# Patient Record
Sex: Male | Born: 1960 | Race: White | Hispanic: No | Smoking: Never smoker
Health system: Southern US, Community
[De-identification: ages and names within clinical notes are randomized; demographics above are authoritative.]

## PROBLEM LIST (undated history)

## (undated) DIAGNOSIS — K5190 Ulcerative colitis, unspecified, without complications: Secondary | ICD-10-CM

## (undated) DIAGNOSIS — Z9889 Other specified postprocedural states: Secondary | ICD-10-CM

## (undated) HISTORY — DX: Ulcerative colitis, unspecified, without complications: K51.90

## (undated) HISTORY — DX: Other specified postprocedural states: Z98.890

---

## 2012-02-21 HISTORY — PX: ILEOSTOMY CLOSURE: SHX1784

## 2013-03-06 HISTORY — PX: COLOPROCTECTOMY W/ ILEO J POUCH: SUR277

## 2015-01-04 ENCOUNTER — Encounter: Payer: Self-pay | Admitting: Physician Assistant

## 2015-01-04 ENCOUNTER — Ambulatory Visit (INDEPENDENT_AMBULATORY_CARE_PROVIDER_SITE_OTHER): Payer: Managed Care, Other (non HMO) | Admitting: Physician Assistant

## 2015-01-04 VITALS — BP 120/88 | HR 78 | Temp 97.7°F | Resp 16 | Ht 68.0 in | Wt 202.0 lb

## 2015-01-04 DIAGNOSIS — Z0000 Encounter for general adult medical examination without abnormal findings: Secondary | ICD-10-CM

## 2015-01-04 DIAGNOSIS — Z113 Encounter for screening for infections with a predominantly sexual mode of transmission: Secondary | ICD-10-CM | POA: Diagnosis not present

## 2015-01-04 DIAGNOSIS — Z8719 Personal history of other diseases of the digestive system: Secondary | ICD-10-CM

## 2015-01-04 DIAGNOSIS — Z131 Encounter for screening for diabetes mellitus: Secondary | ICD-10-CM | POA: Diagnosis not present

## 2015-01-04 DIAGNOSIS — Z125 Encounter for screening for malignant neoplasm of prostate: Secondary | ICD-10-CM | POA: Diagnosis not present

## 2015-01-04 DIAGNOSIS — Z136 Encounter for screening for cardiovascular disorders: Secondary | ICD-10-CM

## 2015-01-04 DIAGNOSIS — Z13220 Encounter for screening for lipoid disorders: Secondary | ICD-10-CM

## 2015-01-04 DIAGNOSIS — Z7689 Persons encountering health services in other specified circumstances: Secondary | ICD-10-CM

## 2015-01-04 DIAGNOSIS — Z7189 Other specified counseling: Secondary | ICD-10-CM

## 2015-01-04 NOTE — Patient Instructions (Signed)

## 2015-01-04 NOTE — Progress Notes (Signed)
Patient: Lawrence Park, Male    DOB: 03/31/1960, 54 y.o.   MRN: [id number] Visit Date: 01/04/2015  Today's Provider: Margaretann Loveless, PA-C   Chief Complaint  Patient presents with  . Establish Care   Subjective:    Annual physical exam Lawrence Park is a 54 y.o. male who presents today for health maintenance and complete physical. He feels fairly well. He reports not exercising, was exercising before. He reports he is sleeping well. Patient needs a referral to the GI. He is status post J-pouch procedure 03/06/2013 due to ulcerative colitis. He is having some increased pain at the site and is also noticing increased bowel movements like he had initially right after the procedure. It has been three years since patient had a physical.Patient Declined Influenza vaccine. "Thinks had a reaction"  Patient's previous PCP was Dr. Reubin Milan in [facility]. He was last seen there approximately a year and a half ago. He does state that he had labs done at that visit and was told that everything was perfectly normal.  Previous GI doctor was Dr. Gunnar Bulla in [place]. He states he did have a colonoscopy postoperatively which was stable and he is to repeat colonoscopy every 3 years which would put his next colonoscopy being due in 2018.  He is divorced 2. He does have 2 children, a son that is 102 and a daughter that is 38.  Only pertinent past family history is that his father passed away at the age of 37 due to cardiac complications secondary to rheumatic fever. His mother is still living and is healthy. His children are healthy as well. -----------------------------------------------------------------   Review of Systems  Constitutional: Negative.   HENT: Negative.   Eyes: Negative.   Respiratory: Negative.   Cardiovascular: Negative.   Gastrointestinal: Positive for abdominal pain and diarrhea (secondary to j-pouch; states he has approx 10 BM daily).  Negative for nausea, vomiting, blood in stool, abdominal distention and rectal pain.  Endocrine: Negative.   Genitourinary: Negative.   Musculoskeletal: Positive for joint swelling and arthralgias (right knee).  Allergic/Immunologic: Negative.   Neurological: Negative.   Hematological: Negative.   Psychiatric/Behavioral: Negative.     Social History      He  reports that he has never smoked. He has never used smokeless tobacco. He reports that he does not drink alcohol or use illicit drugs.       Social History   Social History  . Marital Status: Divorced    Spouse Name: N/A  . Number of Children: N/A  . Years of Education: N/A   Social History Main Topics  . Smoking status: Never Smoker   . Smokeless tobacco: Never Used  . Alcohol Use: No  . Drug Use: No  . Sexual Activity: Not Asked   Other Topics Concern  . None   Social History Narrative  . None    There are no active problems to display for this patient.   Past Surgical History  Procedure Laterality Date  . Coloproctectomy w/ ileo j pouch  03/06/2013    Family History        Family Status  Relation Status Death Age  . Mother Alive   . Father Deceased         His family history includes Heart disease in his father.    Allergies  Allergen Reactions  . Clindamycin/Lincomycin   . Other     PIGWEED AND RAGWEED  . Penicillins  Previous Medications   No medications on file    Patient Care Team: Margaretann LovelessJennifer M Burnette, PA-C as PCP - General (Family Medicine)     Objective:   Vitals: BP 120/88 mmHg  Pulse 78  Temp(Src) 97.7 F (36.5 C) (Oral)  Resp 16  Ht 5\' 8"  (1.727 m)  Wt 202 lb (91.627 kg)  BMI 30.72 kg/m2   Physical Exam  Constitutional: He is oriented to person, place, and time. He appears well-developed and well-nourished.  HENT:  Head: Normocephalic and atraumatic.  Right Ear: External ear normal.  Left Ear: External ear normal.  Nose: Nose normal.  Mouth/Throat: Oropharynx is  clear and moist.  Eyes: Conjunctivae and EOM are normal. Pupils are equal, round, and reactive to light. Right eye exhibits no discharge.  Neck: Normal range of motion. Neck supple. No tracheal deviation present. No thyromegaly present.  Cardiovascular: Normal rate, regular rhythm, normal heart sounds and intact distal pulses.   No murmur heard. Pulmonary/Chest: Effort normal and breath sounds normal. No respiratory distress. He has no wheezes. He has no rales. He exhibits no tenderness.  Abdominal: Soft. Normal appearance. He exhibits no distension and no mass. Bowel sounds are increased. There is no hepatosplenomegaly. There is tenderness in the right lower quadrant, periumbilical area, suprapubic area and left lower quadrant. There is no rebound, no guarding and no CVA tenderness. No hernia.    Musculoskeletal: Normal range of motion. He exhibits no edema or tenderness.  Lymphadenopathy:    He has no cervical adenopathy.  Neurological: He is alert and oriented to person, place, and time. He has normal reflexes. No cranial nerve deficit. He exhibits normal muscle tone. Coordination normal.  Skin: Skin is warm and dry. No rash noted. No erythema.  Psychiatric: He has a normal mood and affect. His behavior is normal. Judgment and thought content normal.     Depression Screen No flowsheet data found.    Assessment & Plan:     Routine Health Maintenance and Physical Exam  1. Annual physical exam Physical exam today was fairly normal besides findings of abdomen. I will refer him to gastroenterology so that he may establish with a gastroenterologist locally. He did undergo a J-pouch procedure for are ulcerative colitis in 2015. He states that he had ileostomy for 8 months following procedure. He also had a postoperative secondary infection that had to be drained as well. Since then he feels he has done well. However over the last couple months he has noticed having increased pain at the J  pouch site as well as having increasing bowel movements again. I will check labs as below and refer him to a gastroenterologist for further evaluation. I will follow-up with him pending his lab results. If labs are within normal limits and stable they will not need to be repeated until his annual physical exam next year. I will however still follow-up with him in 6 months to see how he's doing. He is to call the office if he has any worsening symptoms, acute issues, questions or concerns. - CBC with Differential/Platelet - Comprehensive metabolic panel - TSH  2. Establishing care with new doctor, encounter for [facility] [place]  3. Encounter for prostate cancer screening We'll check labs and follow-up pending lab results. If labs are stable he will not need PSA checked for 1 year. - PSA  4. Encounter for lipid screening for cardiovascular disease I will check labs and follow-up pending lab results. If  labs are stable he will not need his cholesterol checked for 1 year. - Lipid panel  5. Encounter for screening examination for sexually transmitted disease He is requesting testing for her sexual transmitted diseases. He states that his second wife was unfaithful and his most recent girlfriend he questions if she was faithful. He would like to be tested for all STDs they can be tested for. I will follow-up with him pending results. - HIV antibody (with reflex) - RPR - Hepatitis C Antibody - GC/Chlamydia Probe Amp  6. Encounter for screening examination for impaired glucose regulation and diabetes mellitus There is no family history of diabetes or personal history of elevated glucose levels but he states he has not ever had a hemoglobin A1c. We will get a hemoglobin A1c for baseline study. I will follow-up with him pending lab results of labs are stable he will not need this rechecked for 1 year. - Hemoglobin A1c  7. H/O ulcerative colitis I will refer  him to gastroenterology so that he may establish with a gastroenterologist locally. He was previously seen by Dr. Gunnar Bulla in [place], [place]. He did undergo a J-pouch procedure for are ulcerative colitis in 2015. He states that he had ileostomy for 8 months following procedure. He also had a postoperative secondary infection that had to be drained as well. Since then he feels he has done well. However over the last couple months he has noticed having increased pain at the J pouch site as well as having increasing bowel movements again. He does state he is still having bowel movements and is able to pass gas but is worried that he might be developing an adhesion or scar tissue that is causing his pain and discomfort. He is to call the office if he has any worsening symptoms, questions or concerns. - Ambulatory referral to Gastroenterology   Exercise Activities and Dietary recommendations Goals    None       There is no immunization history on file for this patient.  Health Maintenance  Topic Date Due  . Hepatitis C Screening  01/24/1960  . HIV Screening  01/02/1975  . TETANUS/TDAP  01/02/1979  . COLONOSCOPY  01/01/2010  . INFLUENZA VACCINE  08/21/2014      Discussed health benefits of physical activity, and encouraged him to engage in regular exercise appropriate for his age and condition.    --------------------------------------------------------------------

## 2015-01-05 ENCOUNTER — Telehealth: Payer: Self-pay

## 2015-01-05 LAB — COMPREHENSIVE METABOLIC PANEL
ALK PHOS: 86 IU/L (ref 39–117)
ALT: 21 IU/L (ref 0–44)
AST: 22 IU/L (ref 0–40)
Albumin/Globulin Ratio: 1.5 (ref 1.1–2.5)
Albumin: 4.6 g/dL (ref 3.5–5.5)
BILIRUBIN TOTAL: 0.4 mg/dL (ref 0.0–1.2)
BUN/Creatinine Ratio: 10 (ref 9–20)
BUN: 12 mg/dL (ref 6–24)
CHLORIDE: 102 mmol/L (ref 96–106)
CO2: 25 mmol/L (ref 18–29)
Calcium: 10.1 mg/dL (ref 8.7–10.2)
Creatinine, Ser: 1.23 mg/dL (ref 0.76–1.27)
GFR calc non Af Amer: 66 mL/min/{1.73_m2} (ref >59)
GFR, EST AFRICAN AMERICAN: 76 mL/min/{1.73_m2} (ref >59)
GLUCOSE: 88 mg/dL (ref 65–99)
Globulin, Total: 3.1 g/dL (ref 1.5–4.5)
Potassium: 4.9 mmol/L (ref 3.5–5.2)
Sodium: 143 mmol/L (ref 134–144)
TOTAL PROTEIN: 7.7 g/dL (ref 6.0–8.5)

## 2015-01-05 LAB — TSH: TSH: 2.25 u[IU]/mL (ref 0.450–4.500)

## 2015-01-05 LAB — CBC WITH DIFFERENTIAL/PLATELET
BASOS ABS: 0.1 10*3/uL (ref 0.0–0.2)
Basos: 1 %
EOS (ABSOLUTE): 0.4 10*3/uL (ref 0.0–0.4)
Eos: 4 %
Hematocrit: 43.8 % (ref 37.5–51.0)
Hemoglobin: 15 g/dL (ref 12.6–17.7)
IMMATURE GRANS (ABS): 0 10*3/uL (ref 0.0–0.1)
Immature Granulocytes: 1 %
LYMPHS: 39 %
Lymphocytes Absolute: 3.3 10*3/uL — ABNORMAL HIGH (ref 0.7–3.1)
MCH: 31.1 pg (ref 26.6–33.0)
MCHC: 34.2 g/dL (ref 31.5–35.7)
MCV: 91 fL (ref 79–97)
Monocytes Absolute: 0.9 10*3/uL (ref 0.1–0.9)
Monocytes: 11 %
NEUTROS ABS: 3.8 10*3/uL (ref 1.4–7.0)
NEUTROS PCT: 44 %
PLATELETS: 342 10*3/uL (ref 150–379)
RBC: 4.82 x10E6/uL (ref 4.14–5.80)
RDW: 12.8 % (ref 12.3–15.4)
WBC: 8.6 10*3/uL (ref 3.4–10.8)

## 2015-01-05 LAB — LIPID PANEL
CHOL/HDL RATIO: 4.3 ratio (ref 0.0–5.0)
CHOLESTEROL TOTAL: 151 mg/dL (ref 100–199)
HDL: 35 mg/dL — ABNORMAL LOW (ref >39)
LDL Calculated: 88 mg/dL (ref 0–99)
TRIGLYCERIDES: 141 mg/dL (ref 0–149)
VLDL Cholesterol Cal: 28 mg/dL (ref 5–40)

## 2015-01-05 LAB — GC/CHLAMYDIA PROBE AMP
CHLAMYDIA, DNA PROBE: NEGATIVE
Neisseria gonorrhoeae by PCR: NEGATIVE

## 2015-01-05 LAB — HEPATITIS C ANTIBODY: Hep C Virus Ab: 0.1 s/co ratio (ref 0.0–0.9)

## 2015-01-05 LAB — PSA: Prostate Specific Ag, Serum: 0.8 ng/mL (ref 0.0–4.0)

## 2015-01-05 LAB — RPR: RPR Ser Ql: NONREACTIVE

## 2015-01-05 LAB — HIV ANTIBODY (ROUTINE TESTING W REFLEX): HIV SCREEN 4TH GENERATION: NONREACTIVE

## 2015-01-05 LAB — HEMOGLOBIN A1C
Est. average glucose Bld gHb Est-mCnc: 105 mg/dL
HEMOGLOBIN A1C: 5.3 % (ref 4.8–5.6)

## 2015-01-05 NOTE — Telephone Encounter (Signed)
Patient advised as directed below.  Thanks,  -Kaidan Spengler 

## 2015-01-05 NOTE — Telephone Encounter (Signed)
-----   Message from Margaretann LovelessJennifer M Burnette, [place]PA-C sent at 01/05/2015  8:24 AM EST ----- All labs are within normal limits and stable.  All special labs were negative. Urine is still pending. Thanks! -JB

## 2015-01-05 NOTE — Telephone Encounter (Signed)
Patient advised as directed below.  Thanks,  -Joseline 

## 2015-01-05 NOTE — Telephone Encounter (Signed)
-----   Message from Margaretann LovelessJennifer M Burnette, [place]PA-C sent at 01/05/2015  4:52 PM EST ----- Urine results are negative for GC/chlamydia.

## 2015-01-09 ENCOUNTER — Ambulatory Visit: Payer: Managed Care, Other (non HMO) | Admitting: Gastroenterology

## 2015-01-09 ENCOUNTER — Encounter: Payer: Self-pay | Admitting: Gastroenterology

## 2015-01-09 ENCOUNTER — Ambulatory Visit (INDEPENDENT_AMBULATORY_CARE_PROVIDER_SITE_OTHER): Payer: Managed Care, Other (non HMO) | Admitting: Gastroenterology

## 2015-01-09 VITALS — BP 148/78 | HR 70 | Temp 98.1°F | Ht 68.0 in | Wt 195.0 lb

## 2015-01-09 DIAGNOSIS — K5190 Ulcerative colitis, unspecified, without complications: Secondary | ICD-10-CM

## 2015-01-09 NOTE — Progress Notes (Signed)
Gastroenterology Consultation  Referring Provider:     Suella GroveBurnette, Jennifer M, P* Primary Care Physician:  Margaretann LovelessJennifer M Burnette, PA-C Primary Gastroenterologist:  Dr. Servando SnareWohl     Reason for Consultation:     Diarrhea and gas        HPI:   Lawrence SirenBobby Park is a 54 y.o. y/o male referred for consultation & management of diarrhea and gas by Dr. Margaretann LovelessJennifer M Burnette, PA-C.  This patient comes today after having a history of ulcerative colitis with a total colectomy and J-pouch. The patient states it was done approximately 2 years ago. He also reports his last inspection of the area with a scope was approximately 1 year ago. The patient has had problems with irritation around the rectum and anal fissures. The patient also reports that he is now having a lot of gas and bloating with a lot of bowel sounds. He also reports that he is having 10 bowel movements a day. The patient reports that when he moves his bowels there is burning around the area of the anus. There is no report of any unexplained weight loss, fevers, chills, nausea or vomiting. The patient does have some left-sided abdominal pain that he is not sure as do from adhesions or from the gas. There has been an increase in the frequency of the patient having some greasy or fatty stools in the past but as stated above he has not had any unexplained weight loss.  Past Medical History  Diagnosis Date  . Ulcerative colitis (HCC)   . H/O ileostomy [facility](HCC)     Reversal - 02/2012 [facility]    Past Surgical History  Procedure Laterality Date  . Coloproctectomy w/ ileo j pouch  03/06/2013  . I[facility]  02[facility]2012    [facility] [place]    Prior to Admission medications   Not on File    Family History  Problem Relation Age of Onset  . Heart disease Father      Social History  Substance Use Topics  . Smoking status: Never Smoker   . Smokeless tobacco: Never Used  . Alcohol Use: No    Allergies as of  01/09/2015 - Review Complete 01/09/2015  Allergen Reaction Noted  . Clindamycin/lincomycin  01/04/2015  . Other  01/04/2015  . Penicillins  01/04/2015    Review of Systems:    All systems reviewed and negative except where noted in HPI.   Physical Exam:  BP 148/78 mmHg  Pulse 70  Temp(Src) 98.1 F (36.7 C) (Oral)  Ht 5\' 8"  (1.727 m)  Wt 195 lb (88.451 kg)  BMI 29.66 kg/m2 No LMP for male patient. Psych:  Alert and cooperative. Normal mood and affect. General:   Alert,  Well-developed, well-nourished, pleasant and cooperative in NAD Head:  Normocephalic and atraumatic. Eyes:  Sclera clear, no icterus.   Conjunctiva pink. Ears:  Normal auditory acuity. Nose:  No deformity, discharge, or lesions. Mouth:  No deformity or lesions,oropharynx pink & moist. Neck:  Supple; no masses or thyromegaly. Lungs:  Respirations even and unlabored.  Clear throughout to auscultation.   No wheezes, crackles, or rhonchi. No acute distress. Heart:  Regular rate and rhythm; no murmurs, clicks, rubs, or gallops. Abdomen:  Normal bowel sounds.  No bruits.  Soft, non-tender and non-distended without masses, hepatosplenomegaly or hernias noted.  No guarding or rebound tenderness.  Negative Carnett sign.   Rectal:  Deferred.  Msk:  Symmetrical without gross deformities.  Good, equal  movement & strength bilaterally. Pulses:  Normal pulses noted. Extremities:  No clubbing or edema.  No cyanosis. Neurologic:  Alert and oriented x3;  grossly normal neurologically. Skin:  Intact without significant lesions or rashes.  No jaundice. Lymph Nodes:  No significant cervical adenopathy. Psych:  Alert and cooperative. Normal mood and affect.  Imaging Studies: No results found.  Assessment and Plan:   Lawrence Park is a 54 y.o. y/o male today with a history of ulcerative colitis. The patient has had a total colectomy with a J-pouch. The patient has been doing well on that although he just recently started having  more bloating and gas with rectal burning. The patient states that he drinks a lot of milk on a daily basis and is any worrisome symptoms such as unexplained weight loss nausea vomiting fevers or chills. The patient has been told to avoid dairy products for one week to see if his symptoms improve if they do not the patient may need to be treated for bacterial overgrowth. The patient has been explained the plan and will contact me if his symptoms do not improve.   Note: This dictation was prepared with Dragon dictation along with smaller phrase technology. Any transcriptional errors that result from this process are unintentional.

## 2015-02-01 ENCOUNTER — Telehealth: Payer: Self-pay | Admitting: Gastroenterology

## 2015-02-01 NOTE — Telephone Encounter (Signed)
Patient was seen 12/20 for colitis, per patient Dr.Wohl mentioned to him to stop drinking milk and see if it would help any, per patient it has not. Patient said Dr.Wohl mentioned a medication that could be called in. Please call patent

## 2015-02-01 NOTE — Telephone Encounter (Signed)
Have the patient stool sent off for elastase and fecal fat please.

## 2015-02-01 NOTE — Telephone Encounter (Signed)
Spoke with pt and he has tried stopping dairy products as you told him and he is still having problems. Please review his chart and let me know which medication you want to prescribe.

## 2015-02-02 ENCOUNTER — Other Ambulatory Visit: Payer: Self-pay

## 2015-02-02 DIAGNOSIS — K51919 Ulcerative colitis, unspecified with unspecified complications: Secondary | ICD-10-CM

## 2015-02-02 NOTE — Telephone Encounter (Signed)
Pt notified per his last ov if stopping dairy didn't work he would like to check stool for bacterial overgrowth. Advised pt I have sent lab order to [facility] on [address].

## 2015-02-05 LAB — FECAL FAT, QUALITATIVE
FAT QUAL TOTAL STL: NORMAL
Fat Qual Neutral, Stl: NORMAL

## 2015-02-06 ENCOUNTER — Telehealth: Payer: Self-pay

## 2015-02-06 ENCOUNTER — Telehealth: Payer: Self-pay | Admitting: Gastroenterology

## 2015-02-06 NOTE — Telephone Encounter (Signed)
-----   Message from Midge Minium, MD sent at 02/06/2015 12:43 PM EST ----- Let the patient know that the fecal fat was normal and not elevated.

## 2015-02-06 NOTE — Telephone Encounter (Signed)
Patient called wondering if you got the results from his stool sample?

## 2015-02-06 NOTE — Telephone Encounter (Signed)
Pt has been notified of lab results  

## 2015-02-06 NOTE — Telephone Encounter (Signed)
Returned pt's call and notified him of his lab results.

## 2015-02-08 LAB — PANCREATIC ELASTASE, FECAL

## 2015-02-13 ENCOUNTER — Telehealth: Payer: Self-pay

## 2015-02-13 NOTE — Telephone Encounter (Signed)
Please verify if you want me to start this pt on Viberzi. He was the pt we checked his fecal fat and pancreatic enzymes. Both were negative. Pt called today because he is still having diarrhea.

## 2015-02-14 ENCOUNTER — Other Ambulatory Visit: Payer: Self-pay

## 2015-02-14 NOTE — Telephone Encounter (Signed)
Yes this is a patient we talked about.

## 2015-02-14 NOTE — Telephone Encounter (Signed)
Contacted pt to inform him Dr. Servando Snare wants him to try Viberzi. Samples have been put up front for pt to pick up.

## 2015-02-26 ENCOUNTER — Other Ambulatory Visit: Payer: Self-pay

## 2015-02-26 MED ORDER — ELUXADOLINE 100 MG PO TABS: 1.0000 | ORAL_TABLET | Freq: Two times a day (BID) | ORAL | Status: AC

## 2015-04-02 ENCOUNTER — Ambulatory Visit
Admission: RE | Admit: 2015-04-02 | Discharge: 2015-04-02 | Disposition: A | Discharge status: Home / Self Care | Payer: Managed Care, Other (non HMO) | Source: Ambulatory Visit | Attending: Physician Assistant | Admitting: Physician Assistant

## 2015-04-02 ENCOUNTER — Telehealth: Payer: Self-pay

## 2015-04-02 ENCOUNTER — Encounter: Payer: Self-pay | Admitting: Physician Assistant

## 2015-04-02 ENCOUNTER — Ambulatory Visit (INDEPENDENT_AMBULATORY_CARE_PROVIDER_SITE_OTHER): Payer: Managed Care, Other (non HMO) | Admitting: Physician Assistant

## 2015-04-02 VITALS — BP 120/80 | HR 78 | Temp 97.9°F | Resp 16 | Wt 202.8 lb

## 2015-04-02 DIAGNOSIS — R062 Wheezing: Secondary | ICD-10-CM

## 2015-04-02 DIAGNOSIS — R071 Chest pain on breathing: Secondary | ICD-10-CM | POA: Diagnosis present

## 2015-04-02 NOTE — Progress Notes (Signed)
Patient: Lawrence Park Male    DOB: 1960/10/06   55 y.o.   MRN: [id number] Visit Date: 04/02/2015  Today's Provider: Margaretann Loveless, PA-C   Chief Complaint  Patient presents with  . Chest Pain   Subjective:    HPI Chest Pain: Patient complains of chest pain. Onset was 1 week ago, with worsening course since that time. The patient describes the pain as  pressure like and sharp in nature, does not radiate. Patient rates pain as a 8/10 in intensity.  Associated symptoms are chest pain, chest pressure/discomfort and fatigue. Aggravating factors are deep inspiration and movement. Alleviating factors are: none. Patient's cardiac risk factors are advanced age (older than 56 for men, 46 for women).Patient has of Ulcerative Colitis and IBS. Patient reports taking samples of Viberzi for three weeks,Dr. Servando Snare had given him for his diarrhea and he started having chest pain. Per patient he stopped the medicine yesterday, but pain is still there. He also was not able to sleep through the night but was able to sleep better yesterday.  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  He recently (about one month ago) started throwing his disc with his left arm also.  He was worried because it had not been improving without activity.  He also complains of dry cough and runny nose.  He had felt this was just secondary to his allergies and dry work environment. He states it is not uncommon for him to cough up blood streaked sputum while he is at work during allergy season because the air is so dry.  He denies smoking.      Allergies  Allergen Reactions  . Clindamycin/Lincomycin   . Other     PIGWEED AND RAGWEED  . Penicillins    Previous Medications   ELUXADOLINE (VIBERZI) 100 MG TABS    Take 1 tablet by mouth 2 (two) times daily.    Review of Systems  Constitutional: Positive for fatigue (He feels like the  Viberzi is affecting all his symptoms because it has also given him insomnia secondary to "crazy dreams"). Negative for diaphoresis.  HENT: Positive for congestion, postnasal drip and rhinorrhea. Negative for ear pain, sinus pressure, sneezing, sore throat, tinnitus and trouble swallowing.   Respiratory: Positive for cough and chest tightness. Negative for apnea, shortness of breath and wheezing.   Cardiovascular: Positive for chest pain and palpitations. Negative for leg swelling.  Gastrointestinal: Negative for nausea, vomiting, abdominal pain, diarrhea, constipation and blood in stool.  Neurological: Negative for dizziness, syncope, weakness, numbness and headaches.  Psychiatric/Behavioral: Positive for sleep disturbance (states viberzi was causing "crazy dreams").    Social History  Substance Use Topics  . Smoking status: Never Smoker   . Smokeless tobacco: Never Used  . Alcohol Use: No   Objective:   BP 120/80 mmHg  Pulse 78  Temp(Src) 97.9 F (36.6 C) (Oral)  Resp 16  Wt 202 lb 12.8 oz (91.989 kg)  SpO2 98%  Physical Exam  Constitutional: He appears well-developed and well-nourished. No distress.  HENT:  Head: Normocephalic and atraumatic.  Right Ear: Hearing, external ear and ear canal normal. Tympanic membrane is not erythematous and not bulging. A middle ear effusion is present.  Left Ear: Hearing, external ear and ear canal normal. Tympanic membrane is not erythematous and not bulging. A middle ear effusion is present.  Nose:  Mucosal edema and rhinorrhea present. Right sinus exhibits no maxillary sinus tenderness and no frontal sinus tenderness. Left sinus exhibits no maxillary sinus tenderness and no frontal sinus tenderness.  Mouth/Throat: Uvula is midline, oropharynx is clear and moist and mucous membranes are normal. No oropharyngeal exudate, posterior oropharyngeal edema or posterior oropharyngeal erythema.  Eyes: Conjunctivae and EOM are normal. Pupils are equal,  round, and reactive to light. Right eye exhibits no discharge. Left eye exhibits no discharge.  Neck: Normal range of motion. Neck supple. No tracheal deviation present. No Brudzinski's sign and no Kernig's sign noted. No thyromegaly present.  Cardiovascular: Normal rate, regular rhythm, normal heart sounds and intact distal pulses.  Exam reveals no gallop and no friction rub.   No murmur heard. Pulmonary/Chest: Effort normal. No stridor. No respiratory distress. He has no decreased breath sounds. He has wheezes in the left middle field and the left lower field. He has no rhonchi. He has no rales. He exhibits tenderness (mild increased tenderness over left sternal border compared to right). He exhibits no mass and no bony tenderness.  Abdominal: Soft. Bowel sounds are normal. He exhibits no distension. There is no tenderness.  Lymphadenopathy:    He has no cervical adenopathy.  Skin: Skin is warm and dry. He is not diaphoretic.  Vitals reviewed.       Assessment & Plan:     1. Chest pain on breathing EKG was WNL and BP stable.  Chest pain seems more musculoskeletal vs respiratory than cardiac.  Will get CXR due to expiratory wheezing heard on auscultation in LML and LLL and history of dry cough. If CXR is normal will treat as musculoskeletal strain of intercostal vs costochondritis.  I will follow up with him pending xray results. - EKG 12-Lead - DG Chest 2 View; Future  2. Wheezing See above medical treatment plan.       Margaretann LovelessJennifer M Valdemar Mcclenahan, PA-C  [facility]

## 2015-04-02 NOTE — Patient Instructions (Signed)

## 2015-04-02 NOTE — Telephone Encounter (Signed)
Patient advised as directed below.  Thanks,  -Joseline 

## 2015-04-02 NOTE — Telephone Encounter (Signed)
-----   Message from Margaretann LovelessJennifer M Burnette, [place]PA-C sent at 04/02/2015  2:54 PM EDT ----- Normal CXR.  Continue treatment as if it is a costochondritis vs muscle strain with anti-inflammatories x 2 weeks.  Call if no improvement.

## 2015-04-02 NOTE — Telephone Encounter (Signed)
Pt called this morning stating he was taking the samples of Viberzi Dr. Servando SnareWohl had given him for his diarrhea and he started having chest pain. No nausea, vomiting or numbness in his arms noted. Pt stated he stopped the medication yesterday but was still having the pain today. He says the pain has been going on for a week. Advised pt to go to the ER or urgent to be evaluated immediately.

## 2015-06-11 ENCOUNTER — Telehealth: Payer: Self-pay | Admitting: Physician Assistant

## 2015-06-11 DIAGNOSIS — N62 Hypertrophy of breast: Secondary | ICD-10-CM

## 2015-06-11 NOTE — Telephone Encounter (Signed)
Referral ordered

## 2015-06-11 NOTE — Telephone Encounter (Signed)
Do you want to authorize this refferral?

## 2015-06-11 NOTE — Telephone Encounter (Signed)
Pt would like a referral to Dr.Davis-plastic surgeon about a implant and pulled muscle left breast.  Fax # to Dr. Earlene Plateravis is [phone].  Thanks CC

## 2016-06-20 ENCOUNTER — Ambulatory Visit: Admit: 2016-06-20 | Discharge: 2016-06-20 | Payer: BLUE CROSS/BLUE SHIELD | Attending: Family | Primary: Family

## 2016-06-20 DIAGNOSIS — M109 Gout, unspecified: Secondary | ICD-10-CM

## 2016-06-20 MED ORDER — CICLOPIROX 0.77 % TOPICAL CREAM
0.77 % | Freq: Two times a day (BID) | CUTANEOUS | 0 refills | Status: AC
Start: 2016-06-20 — End: ?

## 2016-06-20 MED ORDER — DICLOFENAC 75 MG TAB, DELAYED RELEASE
75 mg | ORAL_TABLET | Freq: Two times a day (BID) | ORAL | 0 refills | Status: AC
Start: 2016-06-20 — End: ?

## 2016-06-20 NOTE — Addendum Note (Signed)
Addended by: Sophronia SimasKEATON, Mayerli Kirst T on: 06/20/2016 12:51 PM      Modules accepted: Orders

## 2016-06-20 NOTE — Progress Notes (Signed)
Chief Complaint   Patient presents with   ??? New Patient     est care   ??? Gout       Preston Smith is a 56 y.o. male        HPI    Patient here to establish care.  States that the right great toe is swollen. States that has been treated before.  States that right toe flared up about 6 months, thought would change diet, and hasn't helped.  He has slowed down on drinking alcohol as well.  States that tries not to eat the animal organs.  Having pain in the hands as well. States that thought the toe had a bunion , but feels like size of a butter bean.  States that has a real high pain tolerance.    History reviewed. No pertinent past medical history.    Social History     Social History   ??? Marital status: UNKNOWN     Spouse name: N/A   ??? Number of children: N/A   ??? Years of education: N/A     Occupational History   ??? Not on file.     Social History Main Topics   ??? Smoking status: Never Smoker   ??? Smokeless tobacco: Never Used   ??? Alcohol use No   ??? Drug use: Not on file   ??? Sexual activity: Not on file     Other Topics Concern   ??? Not on file     Social History Narrative   ??? No narrative on file       Family History   Problem Relation Age of Onset   ??? No Known Problems Mother    ??? No Known Problems Father              Ros- see hpi      Visit Vitals   ??? BP 129/89 (BP 1 Location: Left arm, BP Patient Position: Sitting)   ??? Pulse 71   ??? Temp 97.1 ??F (36.2 ??C) (Tympanic)   ??? Ht 5' 8" (1.727 m)   ??? Wt 202 lb 9.6 oz (91.9 kg)   ??? BMI 30.81 kg/m2           Physical Examination: General appearance - alert, well appearing, and in no distress and oriented to person, place, and time  Mental status - alert, oriented to person, place, and time, normal mood, behavior, speech, dress, motor activity, and thought processes  Eyes - pupils equal and reactive, extraocular eye movements intact  Ears - bilateral TM's and external ear canals normal  Nose - normal and patent, no erythema, discharge or polyps   Mouth - mucous membranes moist, pharynx normal without lesions  Neck - supple, no significant adenopathy  Lymphatics - no palpable lymphadenopathy, no hepatosplenomegaly  Chest - clear to auscultation, no wheezes, rales or rhonchi, symmetric air entry  Heart - normal rate, regular rhythm, normal S1, S2, no murmurs, rubs, clicks or gallops  Abdomen - soft, nontender, nondistended, no masses or organomegaly  Neurological - alert, oriented, normal speech, no focal findings or movement disorder noted  Musculoskeletal - no joint tenderness, deformity or swelling  Skin - normal coloration and turgor, no rashes, no suspicious skin lesions noted        .  Assessment and plan:        1. Gouty arthritis of right great toe    - Uric Acid, Serum (84550)  - Comp. Metabolic Panel (14) (14388)  -  CBC With Differential/Platelet (43606)  - C-Reactive Protein, Quant [phone])  - SED RATE (ESR)  - RHEUMATOID FACTOR, QL  - ANA QL, W/Reflex Cascade [phone]) -[phone]  - CCP Antibodies IgG/IgA [phone])    2. Gout, unspecified cause, unspecified chronicity, unspecified site    - Uric Acid, Serum (84550)  - Comp. Metabolic Panel (14) (16244)  - CBC With Differential/Platelet (69507)  - C-Reactive Protein, Quant [phone])  - SED RATE (ESR)  - RHEUMATOID FACTOR, QL  - ANA QL, W/Reflex Cascade [phone]) -[phone]  - CCP Antibodies IgG/IgA (89842)    3. Pain in both hands    - CBC With Differential/Platelet (10312)  - C-Reactive Protein, Quant [phone])  - SED RATE (ESR)  - RHEUMATOID FACTOR, QL  - ANA QL, W/Reflex Cascade [phone]) -[phone]  - CCP Antibodies IgG/IgA (86200)    4. Swelling of both hands  Will check some blood test today,  bcause of pain in hands and feet, I think he has gouty arthrtisi.  i'm going to see what lab work looks like first. Xray of the right foot.   - TSH (94707)  - CBC With Differential/Platelet (61518)  - C-Reactive Protein, Quant [phone])  - SED RATE (ESR)  - RHEUMATOID FACTOR, QL  - ANA QL, W/Reflex Cascade [phone]) -[phone]   - CCP Antibodies IgG/IgA (86200)    5. Screening cholesterol level    - Lipid Panel (80061)    6. Need for hepatitis C screening test    - HEPATITIS C AB    7. Prostate cancer screening    - Prostate-Specific Ag, Serum [phone])

## 2016-06-22 LAB — HEPATITIS C ANTIBODY: HCV Ab: <0.1 s/co ratio (ref 0.0–0.9)

## 2016-06-22 LAB — ANA QL, W/REFLEX CASCADE
ANA DIRECT, 165188: NEGATIVE
ANA Direct: NEGATIVE

## 2016-06-22 LAB — C REACTIVE PROTEIN, QT: C-Reactive Protein, Qt: 2.2 mg/L (ref 0.0–4.9)

## 2016-06-22 LAB — CBC WITH AUTOMATED DIFF
ABS. BASOPHILS: 0 10*3/uL (ref 0.0–0.2)
ABS. EOSINOPHILS: 0.4 10*3/uL (ref 0.0–0.4)
ABS. IMM. GRANS.: 0 10*3/uL (ref 0.0–0.1)
ABS. MONOCYTES: 0.8 10*3/uL (ref 0.1–0.9)
ABS. NEUTROPHILS: 3.5 10*3/uL (ref 1.4–7.0)
Abs Lymphocytes: 3.4 10*3/uL — ABNORMAL HIGH (ref 0.7–3.1)
BASOPHILS: 0 %
EOSINOPHILS: 5 %
HCT: 47.4 % (ref 37.5–51.0)
HGB: 15.7 g/dL (ref 13.0–17.7)
IMMATURE GRANULOCYTES: 0 %
Lymphocytes: 42 %
MCH: 30.5 pg (ref 26.6–33.0)
MCHC: 33.1 g/dL (ref 31.5–35.7)
MCV: 92 fL (ref 79–97)
MONOCYTES: 9 %
NEUTROPHILS: 44 %
PLATELET: 305 10*3/uL (ref 150–379)
RBC: 5.15 x10E6/uL (ref 4.14–5.80)
RDW: 13.1 % (ref 12.3–15.4)
WBC: 8 10*3/uL (ref 3.4–10.8)

## 2016-06-22 LAB — METABOLIC PANEL, COMPREHENSIVE
A-G Ratio: 1.2 (ref 1.2–2.2)
ALT (SGPT): 31 IU/L (ref 0–44)
AST (SGOT): 26 IU/L (ref 0–40)
Albumin: 4.1 g/dL (ref 3.6–4.8)
Alk. phosphatase: 74 IU/L (ref 39–117)
BUN/Creatinine ratio: 11 (ref 10–24)
BUN: 14 mg/dL (ref 8–27)
Bilirubin, total: 0.4 mg/dL (ref 0.0–1.2)
CO2: 24 mmol/L (ref 18–29)
Calcium: 9.5 mg/dL (ref 8.6–10.2)
Chloride: 102 mmol/L (ref 96–106)
Creatinine: 1.28 mg/dL — ABNORMAL HIGH (ref 0.76–1.27)
GFR est AA: 67 mL/min/{1.73_m2} (ref >59)
GFR est non-AA: 58 mL/min/{1.73_m2} — ABNORMAL LOW (ref >59)
GLOBULIN, TOTAL: 3.3 g/dL (ref 1.5–4.5)
Glucose: 87 mg/dL (ref 65–99)
Potassium: 4.5 mmol/L (ref 3.5–5.2)
Protein, total: 7.4 g/dL (ref 6.0–8.5)
Sodium: 140 mmol/L (ref 134–144)

## 2016-06-22 LAB — RHEUMATOID FACTOR, QL: Rheumatoid factor: 26.4 IU/mL — ABNORMAL HIGH (ref 0.0–13.9)

## 2016-06-22 LAB — TSH 3RD GENERATION: TSH: 1.58 u[IU]/mL (ref 0.450–4.500)

## 2016-06-22 LAB — SED RATE (ESR): Sed rate (ESR): 9 mm/hr (ref 0–30)

## 2016-06-22 LAB — URIC ACID: Uric acid: 6.4 mg/dL (ref 3.7–8.6)

## 2016-06-22 LAB — LIPID PANEL
Cholesterol, total: 154 mg/dL (ref 100–199)
HDL Cholesterol: 34 mg/dL — ABNORMAL LOW (ref >39)
LDL, calculated: 71 mg/dL (ref 0–99)
Triglyceride: 246 mg/dL — ABNORMAL HIGH (ref 0–149)
VLDL, calculated: 49 mg/dL — ABNORMAL HIGH (ref 5–40)

## 2016-06-22 LAB — CYCLIC CITRUL PEPTIDE AB, IGG: CCP Antibodies IgG/IgA: 29 units — ABNORMAL HIGH (ref 0–19)

## 2016-06-22 LAB — HEPATITIS C AB: Hep C Virus Ab: <0.1 s/co ratio (ref 0.0–0.9)

## 2016-06-22 LAB — PSA, DIAGNOSTIC (PROSTATE SPECIFIC AG): Prostate Specific Ag: 0.8 ng/mL (ref 0.0–4.0)

## 2016-06-23 ENCOUNTER — Inpatient Hospital Stay: Admit: 2016-06-23 | Payer: BLUE CROSS/BLUE SHIELD | Primary: Family

## 2016-06-23 DIAGNOSIS — M79641 Pain in right hand: Secondary | ICD-10-CM

## 2016-06-23 NOTE — Progress Notes (Signed)
There is a bone spur on the great toe.  Due to labs would recommend he see rheumatology.

## 2016-06-23 NOTE — Progress Notes (Signed)
Cholesterol is normal  Thyroid normal  Uric acid levels are actually in ok range  Glucose, kidney, liver, electrolytes are good.  Blood counts, prostate are normal  He does have a positive Rh factor (rheumatoid factor), and CCP antibodies.  These are screening test for rheumatoid arthritis.  Because they are positive, I need to get him to see a rheumatologist to evaluate and treat him. I also want to order him some xrays of the right foot, and hands before we have him see the rheumatolgist.  i'm starting that referral today, they should be in contact with him.

## 2016-06-23 NOTE — Addendum Note (Signed)
Addended by: Sophronia SimasKEATON, Jalesa Thien T on: 06/23/2016 07:58 AM      Modules accepted: Orders

## 2016-06-23 NOTE — Progress Notes (Signed)
xrays of hands are normal.  Please make appt. With rheumatology when they call him

## 2016-06-23 NOTE — Progress Notes (Signed)
Pt notified and expressed understanding.

## 2016-06-24 NOTE — Progress Notes (Signed)
Pt notified of foot X-ray results, told pt that he needs to see a Rheumatologist. Referral already made

## 2016-06-24 NOTE — Progress Notes (Signed)
Pt notified, referral made.

## 2017-03-25 IMAGING — CR DG CHEST 2V
1 series · 2 of 2 positions shown · non-contrast
Comparison: None.

CLINICAL DATA: Left chest pain for 1-1/2 weeks.

EXAM:
CHEST  2 VIEW

[Series 1: dg chest 2 view · 0.14mm/px · 2 of 2 slices shown]
[im 1/2]
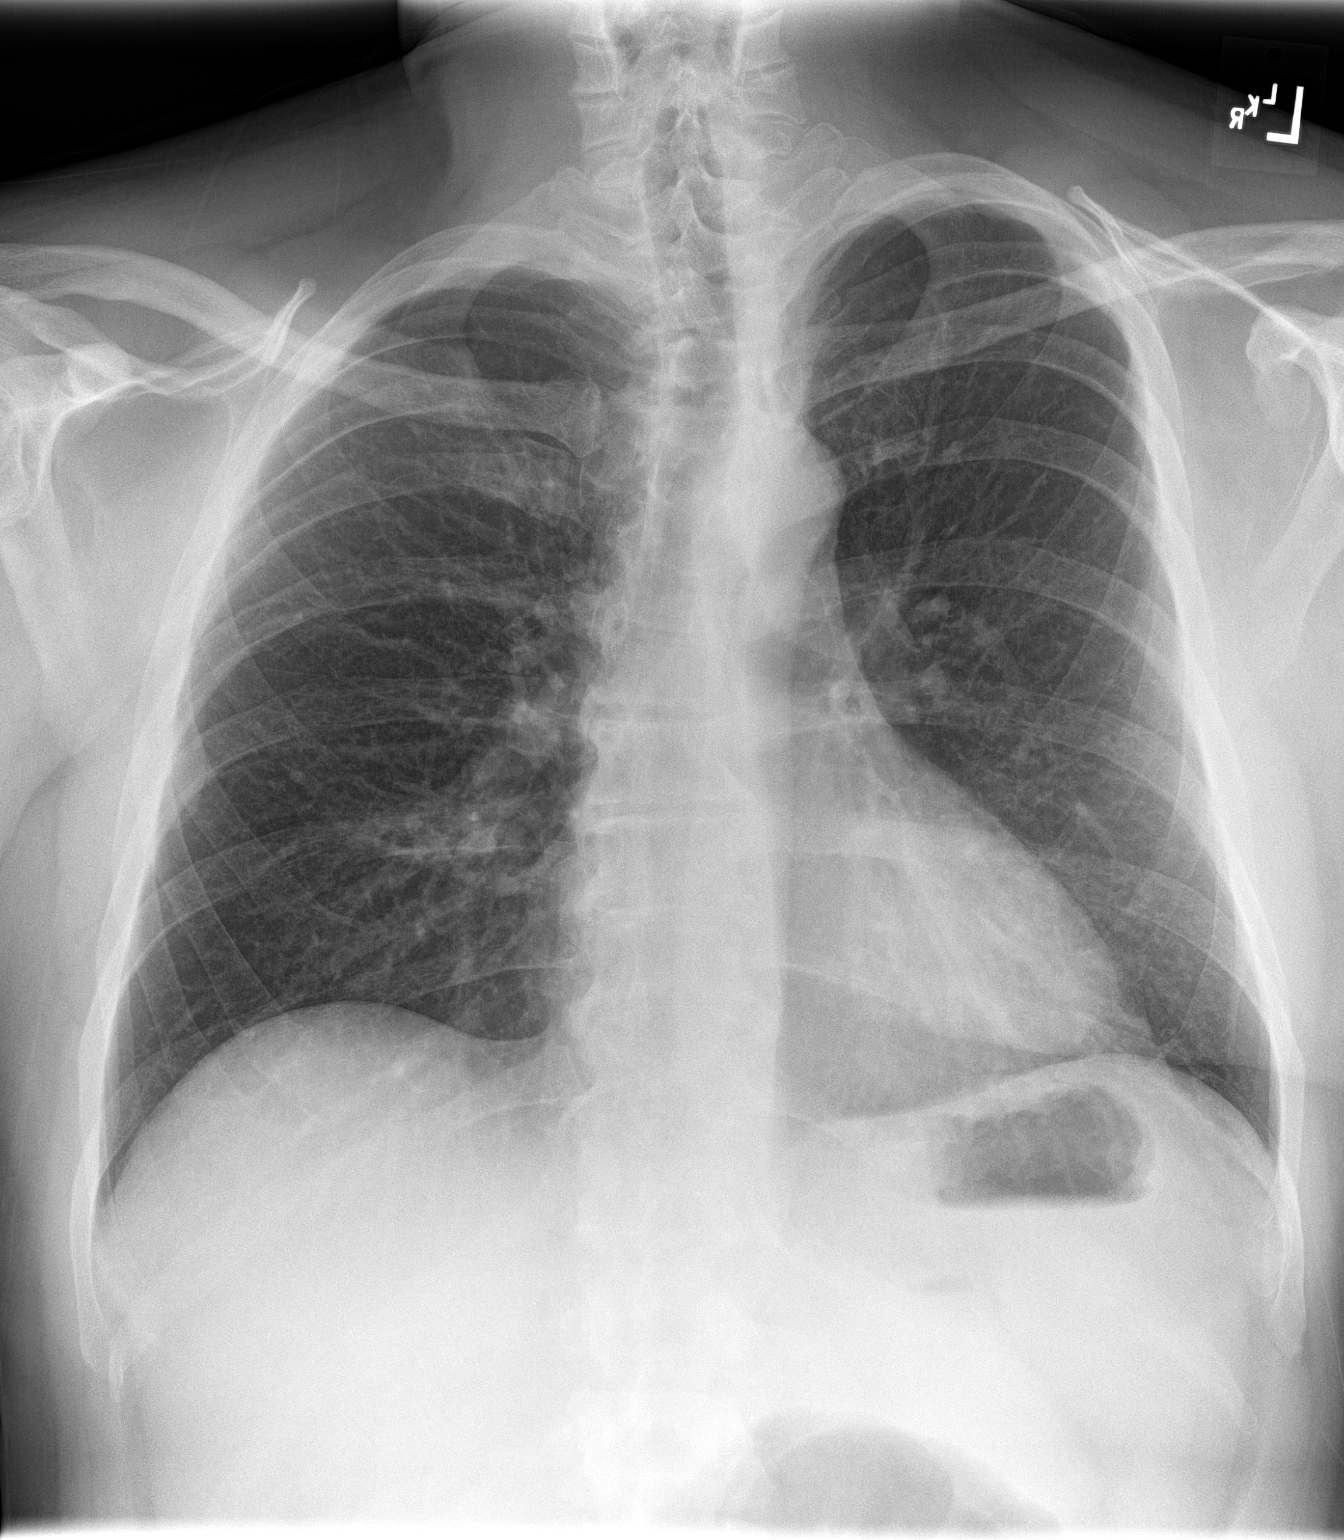
[im 2/2]
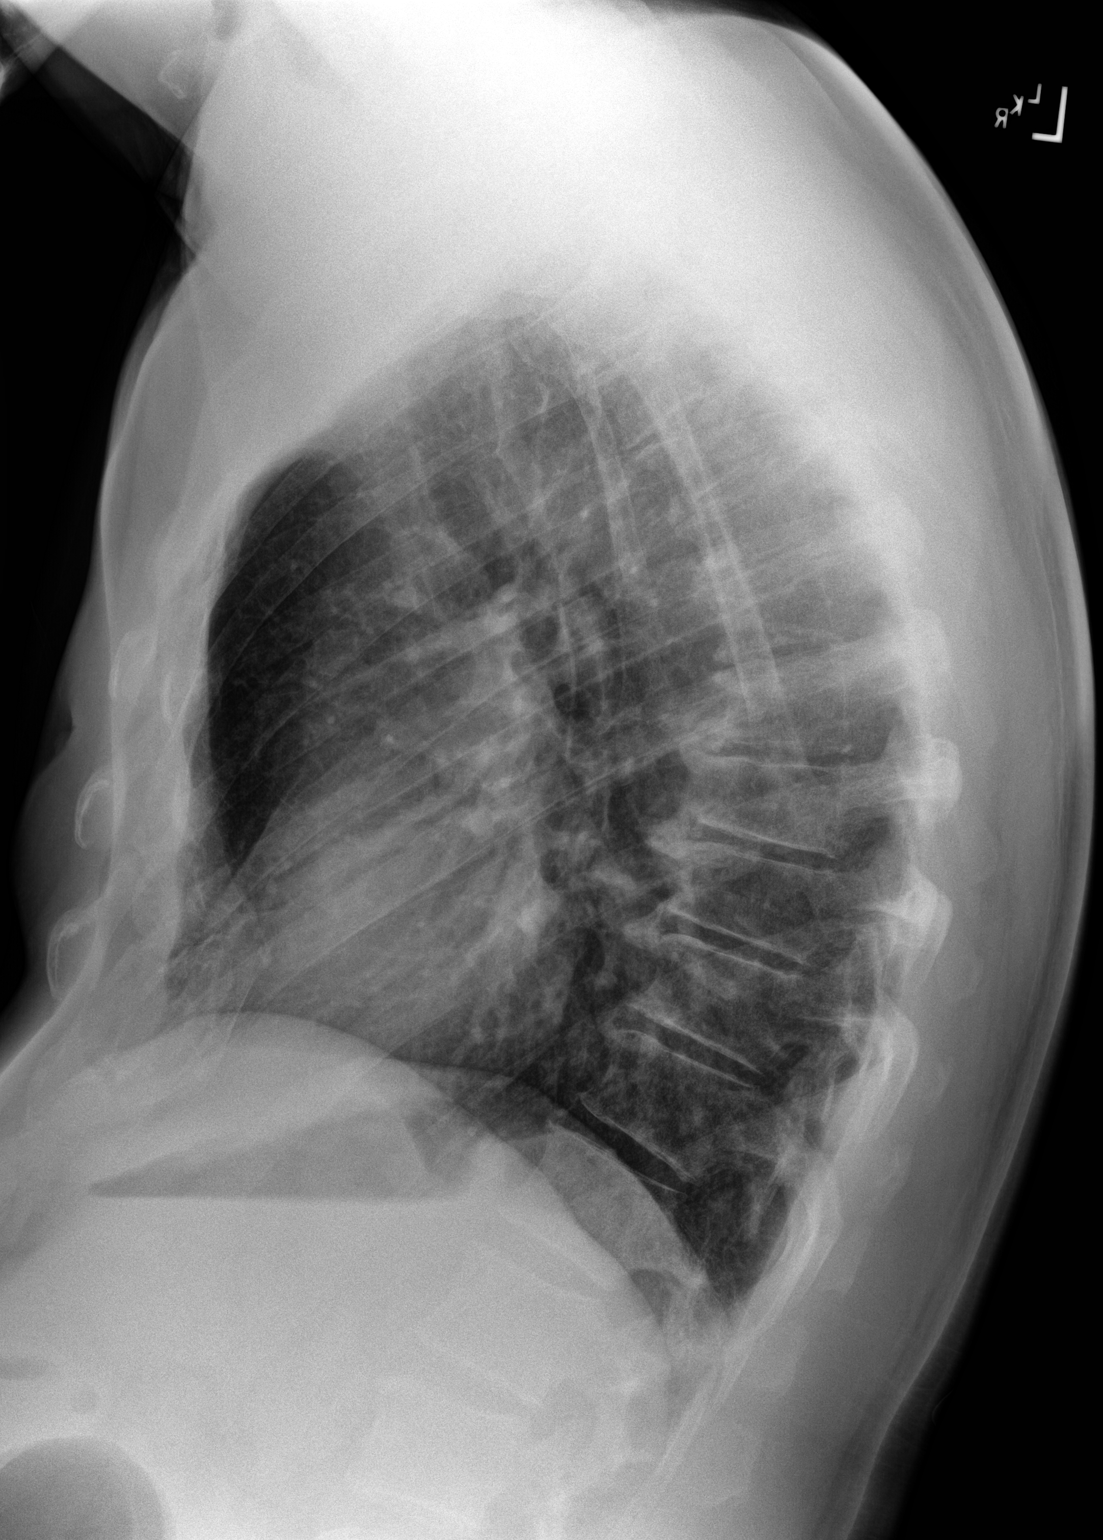

[2 of 2 positions shown; findings below may reference images not displayed]

FINDINGS: The cardiomediastinal silhouette is unremarkable.

There is no evidence of focal airspace disease, pulmonary edema,
suspicious pulmonary nodule/mass, pleural effusion, or pneumothorax.
No acute bony abnormalities are identified.

Mild pectus excavatum deformity and thoracic spondylosis noted.
IMPRESSION: No active cardiopulmonary disease.

## 2017-08-19 NOTE — Addendum Note (Signed)
Addendum Note by Hermine MessickPerkins, Lannie Heaps J., MD at 08/19/17 1601                Author: Hermine MessickPerkins, Konner Saiz J., MD  Service: --  Author Type: Physician       Filed: 08/29/17 1522  Encounter Date: 08/19/2017  Status: Signed          Editor: Hermine MessickPerkins, Tyrus Wilms J., MD (Physician)          Addended by: Hermine MessickPERKINS, Alda Gaultney J. on: 08/29/2017 03:22 PM    Modules accepted: Orders

## 2017-08-19 NOTE — Addendum Note (Signed)
Addended by: Danaja Lasota J. on: 08/29/2017 03:22 PM     Modules accepted: Orders

## 2017-09-29 ENCOUNTER — Inpatient Hospital Stay: Primary: Family

## 2017-10-05 ENCOUNTER — Inpatient Hospital Stay: Payer: BLUE CROSS/BLUE SHIELD

## 2017-10-05 NOTE — Progress Notes (Signed)
Dr. Alycia Rossettiyan and this RN confirmed with pt that he requested he does not want to receive sedation during procedure.

## 2017-10-05 NOTE — Op Note (Signed)
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  The endoscope was advanced to the ileum.  On withdrawal, the colon was carefully inspected. Retroflexion was performed in the pouch. The patient was taken to the recovery area in stable condition.    FINDINGS:  Prep quality was adequate. There was a small segment of anal transition zone, approximately 2 cm in length. There was a 2 mm ulcer in the transition zone, biopsies taken. 4 quadrant biopsies taken of the anal transition zone and placed in separate jar. Other than the ulcer, the mucosa appeared normal. The J pouch was inspected and there was a retained surgical staple, which was removed easily with biopsy forceps. The mucosa otherwise appeared normal. The afferent limb appeared normal.     Estimated blood loss: 0-minimal         IMPRESSION:  1. Small transition zone ulcer  2. S/p surveillance biopsies   3. Removal of surgical staple   4. Normal appearing J pouch     PLAN:  1. Follow up with referring MD   2. Follow up pathology   3. Consider surveillance in 3 years, although optimum management is unknown at this time       Darrall Dears, MD  [facility] [place]

## 2017-10-05 NOTE — H&P (Signed)
H&P by Hermine Messick., MD at 10/05/17 [phone]                Author: Hermine Messick., MD  Service: Gastroenterology  Author Type: Physician       Filed: 10/05/17 0933  Date of Service: 10/05/17 0931  Status: Signed          Editor: Hermine Messick., MD (Physician)                  [facility]                Chief Complaint:  UC surveillance         Subjective:        History of Present Illness:  Patient is a 57 y.o.   Who presents for pouchoscopy for surveillance. Has a rectal cuff after colectomy       PMH:   History reviewed. No pertinent past medical history.      PSH:     Past Surgical History:         Procedure  Laterality  Date          ?  HX OTHER SURGICAL    2015          reversed ilioscopy colonless.           Allergies:     Allergies        Allergen  Reactions         ?  Clindamycin  Shortness of Breath         ?  Penicillins  Shortness of Breath           Home Medications:     Prior to Admission medications             Medication  Sig  Start Date  End Date  Taking?  Authorizing Provider            folic acid (FOLVITE) 1 mg tablet  Take 1 mg by mouth daily.      Yes  Provider, Historical     methotrexate (RHEUMATREX) 2.5 mg tablet  Take 2.5 mg by mouth Every Friday.      Yes  Provider, Historical     ciclopirox (LOPROX) 0.77 % topical cream  Apply  to affected area two (2) times a day.  06/20/16    Yes  Earney Hamburg, NP            diclofenac EC (VOLTAREN) 75 mg EC tablet  Take 1 Tab by mouth two (2) times a day.  06/20/16      Earney Hamburg, NP           Hospital Medications:     No current facility-administered medications for this encounter.            Social History:     Social History          Tobacco Use         ?  Smoking status:  Never Smoker     ?  Smokeless tobacco:  Never Used       Substance Use Topics         ?  Alcohol use:  Yes             Comment: occ        Pt denies any history of IV drug use, blood transfusions, tattoos       Family  History:     Family History         Problem  Relation  Age of Onset          ?  No Known Problems  Mother            ?  No Known Problems  Father             Review of Systems:   A detailed 10 system ROS is obtained, with pertinent positives as listed above.  All others are negative.      Diet:          Objective:        Physical Exam:   Vitals:   Visit Vitals      BP  129/74     Pulse  65     Temp  97.6 ??F (36.4 ??C)     Resp  16     Ht  5\' 8"  (1.727 m)     Wt  88.5 kg (195 lb)     SpO2  97%        BMI  29.65 kg/m??        Gen:  Pt is alert, cooperative, no acute distress   Skin:  Extremities and face reveal no rashes.   HEENT: Sclerae anicteric.  Extra-occular muscles are intact.  No oral ulcers.  The neck is supple.   Cardiovascular: Regular rate and rhythm. No murmurs, gallops, or rubs.   Respiratory:  Comfortable breathing with no accessory muscle use. Clear breath sounds anteriorly with no wheezes, rales, or rhonchi.   GI:  Abdomen nondistended, soft, and nontender.  Normal active bowel sounds. No masses palpable.   Musculoskeletal:  Extremities have good range of motion.  No costovertebral tenderness.   Neurological:  Gross memory appears intact.  Patient is alert and oriented.   Psychiatric:  Mood appears appropriate with judgement intact.   Lymphatic:  No cervical or supraclavicular adenopathy.        Assessment:           Active Problems:     * No active hospital problems. *           Plan:           Proceed to pouchoscopy as planned. ASA II

## 2017-10-05 NOTE — Op Note (Signed)
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  The endoscope was advanced to the ileum.  On withdrawal, the colon was carefully inspected. Retroflexion was performed in the pouch. The patient was taken to the recovery area in stable condition.    FINDINGS:  Prep quality was adequate. There was a small segment of anal transition zone, approximately 2 cm in length. There was a 2 mm ulcer in the transition zone, biopsies taken. 4 quadrant biopsies taken of the anal transition zone and placed in separate jar. Other than the ulcer, the mucosa appeared normal. The J pouch was inspected and there was a retained surgical staple, which was removed easily with biopsy forceps. The mucosa otherwise appeared normal. The afferent limb appeared normal.     Estimated blood loss: 0-minimal         IMPRESSION:  1. Small transition zone ulcer  2. S/p surveillance biopsies   3. Removal of surgical staple   4. Normal appearing J pouch     PLAN:  1. Follow up with referring MD   2. Follow up pathology   3. Consider surveillance in 3 years, although optimum management is unknown at this time       Will Winefred Hillesheim, MD  [facility]

## 2017-10-05 NOTE — Progress Notes (Signed)
Dr. Ryan and this RN confirmed with pt that he requested he does not want to receive sedation during procedure.

## 2017-10-05 NOTE — H&P (Signed)
[facility]          Chief Complaint:  UC surveillance     Subjective:     History of Present Illness:  Patient is a 57 y.o.  Who presents for pouchoscopy for surveillance. Has a rectal cuff after colectomy     PMH:  History reviewed. No pertinent past medical history.    PSH:  Past Surgical History:   Procedure Laterality Date   ??? HX OTHER SURGICAL  2015    reversed ilioscopy colonless.       Allergies:  Allergies   Allergen Reactions   ??? Clindamycin Shortness of Breath   ??? Penicillins Shortness of Breath       Home Medications:  Prior to Admission medications    Medication Sig Start Date End Date Taking? Authorizing Provider   folic acid (FOLVITE) 1 mg tablet Take 1 mg by mouth daily.   Yes Provider, Historical   methotrexate (RHEUMATREX) 2.5 mg tablet Take 2.5 mg by mouth Every Friday.   Yes Provider, Historical   ciclopirox (LOPROX) 0.77 % topical cream Apply  to affected area two (2) times a day. 06/20/16  Yes Earney HamburgKeaton, Matthew T, NP   diclofenac EC (VOLTAREN) 75 mg EC tablet Take 1 Tab by mouth two (2) times a day. 06/20/16   Earney HamburgKeaton, Matthew T, NP       Hospital Medications:  No current facility-administered medications for this encounter.        Social History:  Social History     Tobacco Use   ??? Smoking status: Never Smoker   ??? Smokeless tobacco: Never Used   Substance Use Topics   ??? Alcohol use: Yes     Comment: occ     Pt denies any history of IV drug use, blood transfusions, tattoos     Family History:  Family History   Problem Relation Age of Onset   ??? No Known Problems Mother    ??? No Known Problems Father        Review of Systems:  A detailed 10 system ROS is obtained, with pertinent positives as listed above.  All others are negative.    Diet:      Objective:     Physical Exam:  Vitals:  Visit Vitals  BP 129/74   Pulse 65   Temp 97.6 ??F (36.4 ??C)   Resp 16   Ht 5\' 8"  (1.727 m)   Wt 88.5 kg (195 lb)   SpO2 97%   BMI 29.65 kg/m??      Gen:  Pt is alert, cooperative, no acute distress  Skin:  Extremities and face reveal no rashes.  HEENT: Sclerae anicteric.  Extra-occular muscles are intact.  No oral ulcers.  The neck is supple.  Cardiovascular: Regular rate and rhythm. No murmurs, gallops, or rubs.  Respiratory:  Comfortable breathing with no accessory muscle use. Clear breath sounds anteriorly with no wheezes, rales, or rhonchi.  GI:  Abdomen nondistended, soft, and nontender.  Normal active bowel sounds. No masses palpable.  Musculoskeletal:  Extremities have good range of motion.  No costovertebral tenderness.  Neurological:  Gross memory appears intact.  Patient is alert and oriented.  Psychiatric:  Mood appears appropriate with judgement intact.  Lymphatic:  No cervical or supraclavicular adenopathy.    Assessment:       Active Problems:    * No active hospital problems. *      Plan:  Proceed to pouchoscopy as planned. ASA II

## 2017-10-05 NOTE — Other (Signed)
VSS at discharge. No complaints noted. Education reviewed and signed with patient.
